# Patient Record
Sex: Male | Born: 1980 | Race: White | Hispanic: No | Marital: Single | Smoking: Never smoker
Health system: Southern US, Community
[De-identification: ages and names within clinical notes are randomized; demographics above are authoritative.]

## PROBLEM LIST (undated history)

## (undated) DIAGNOSIS — R109 Unspecified abdominal pain: Secondary | ICD-10-CM

## (undated) DIAGNOSIS — F419 Anxiety disorder, unspecified: Secondary | ICD-10-CM

## (undated) DIAGNOSIS — T7840XA Allergy, unspecified, initial encounter: Secondary | ICD-10-CM

## (undated) DIAGNOSIS — N200 Calculus of kidney: Secondary | ICD-10-CM

## (undated) DIAGNOSIS — J45909 Unspecified asthma, uncomplicated: Secondary | ICD-10-CM

## (undated) DIAGNOSIS — E119 Type 2 diabetes mellitus without complications: Secondary | ICD-10-CM

## (undated) DIAGNOSIS — I10 Essential (primary) hypertension: Secondary | ICD-10-CM

## (undated) DIAGNOSIS — M1990 Unspecified osteoarthritis, unspecified site: Secondary | ICD-10-CM

## (undated) HISTORY — DX: Unspecified abdominal pain: R10.9

## (undated) HISTORY — DX: Essential (primary) hypertension: I10

## (undated) HISTORY — PX: SURGERY SCROTAL / TESTICULAR: SUR1316

## (undated) HISTORY — DX: Unspecified asthma, uncomplicated: J45.909

## (undated) HISTORY — DX: Unspecified osteoarthritis, unspecified site: M19.90

## (undated) HISTORY — DX: Allergy, unspecified, initial encounter: T78.40XA

## (undated) HISTORY — DX: Type 2 diabetes mellitus without complications: E11.9

## (undated) HISTORY — DX: Anxiety disorder, unspecified: F41.9

## (undated) HISTORY — DX: Calculus of kidney: N20.0

## (undated) HISTORY — PX: KNEE SURGERY: SHX244

---

## 2014-04-21 HISTORY — PX: CHOLECYSTECTOMY: SHX55

## 2014-09-22 HISTORY — PX: HEMORRHOID SURGERY: SHX153

## 2018-01-30 DIAGNOSIS — R0789 Other chest pain: Secondary | ICD-10-CM

## 2018-01-30 DIAGNOSIS — E119 Type 2 diabetes mellitus without complications: Secondary | ICD-10-CM

## 2018-01-30 DIAGNOSIS — I10 Essential (primary) hypertension: Secondary | ICD-10-CM

## 2018-01-30 DIAGNOSIS — R079 Chest pain, unspecified: Secondary | ICD-10-CM

## 2018-01-30 DIAGNOSIS — E785 Hyperlipidemia, unspecified: Secondary | ICD-10-CM

## 2018-02-01 DIAGNOSIS — F419 Anxiety disorder, unspecified: Secondary | ICD-10-CM

## 2018-02-01 DIAGNOSIS — R0789 Other chest pain: Secondary | ICD-10-CM

## 2018-02-01 DIAGNOSIS — I10 Essential (primary) hypertension: Secondary | ICD-10-CM

## 2018-02-01 DIAGNOSIS — R079 Chest pain, unspecified: Secondary | ICD-10-CM

## 2019-08-18 HISTORY — PX: UPPER GASTROINTESTINAL ENDOSCOPY: SHX188

## 2019-08-18 HISTORY — PX: ESOPHAGOGASTRODUODENOSCOPY: SHX1529

## 2019-09-27 ENCOUNTER — Telehealth: Payer: Self-pay

## 2019-09-27 NOTE — Telephone Encounter (Signed)
Recv'd records from [facility] forwarded 17 pages to [facility] 6/8/21fbg

## 2019-09-29 ENCOUNTER — Telehealth: Payer: Self-pay | Admitting: Gastroenterology

## 2019-09-29 NOTE — Telephone Encounter (Signed)
Dr. Chales Abrahams, this patient requested to transfer care to you.  He was recently seen at [facility].  He would like to be evaluated for vomiting and weight loss.  He stated that he felts that his previous GI MD was dismissive of his symptoms.  Records are available in [facility] for review.  Please advise whether you will accept this patient.     Aleen Sells

## 2019-09-30 NOTE — Telephone Encounter (Signed)
Can schedule with me or any APP More than happy to help in any way I can RG

## 2019-10-03 ENCOUNTER — Encounter: Payer: Self-pay | Admitting: Gastroenterology

## 2019-10-04 NOTE — Telephone Encounter (Signed)
Appt made with Dr Chales Abrahams on 10/06/19 - pt aware

## 2019-10-06 ENCOUNTER — Encounter: Payer: Self-pay | Admitting: Gastroenterology

## 2019-10-06 ENCOUNTER — Ambulatory Visit: Payer: Self-pay | Admitting: Gastroenterology

## 2019-10-06 VITALS — BP 126/84 | HR 77 | Temp 98.4°F | Ht 71.0 in | Wt 195.5 lb

## 2019-10-06 DIAGNOSIS — R634 Abnormal weight loss: Secondary | ICD-10-CM

## 2019-10-06 DIAGNOSIS — R112 Nausea with vomiting, unspecified: Secondary | ICD-10-CM

## 2019-10-06 DIAGNOSIS — R1011 Right upper quadrant pain: Secondary | ICD-10-CM

## 2019-10-06 DIAGNOSIS — R1013 Epigastric pain: Secondary | ICD-10-CM

## 2019-10-06 MED ORDER — AMITRIPTYLINE HCL 25 MG PO TABS: 25.0000 mg | ORAL_TABLET | Freq: Every day | ORAL | 0 refills | Status: DC

## 2019-10-06 MED ORDER — RABEPRAZOLE SODIUM 20 MG PO TBEC
20.0000 mg | DELAYED_RELEASE_TABLET | Freq: Every day | ORAL | 6 refills | Status: DC
Start: 2019-10-06 — End: 2019-11-09

## 2019-10-06 NOTE — Progress Notes (Signed)
Chief Complaint: RUQ pain  Referring Provider:  Retia Passe, NP      ASSESSMENT AND PLAN;   #1.  Longstanding RUQ pain. Neg extensive GI work-up as detailed below including EGD, [place], CT AP, GES. S/P lap chole d/t biliary dyskinesia.  Failed multiple medications including BuSpar, Bentyl, Phenergan, Carafate, Protonix.  Has associated back pain/anxiety/depression.  Likely has associated nonulcer dyspepsia.  #2. GERD with [facility]. Assoc N/V  #3. Wt loss  #4. Fatty liver with Nl LFTs.  No cirrhosis. Dx [place] 04/2015  Plan: -Check CBC, CMP, Celiac, CRP, TSH, lipase. -Aciphex 20mg  po qd, #30, 6 refills. -UGI with SB series. -FU in 4 weeks. -At FU, would consider colonoscopy (d/t significant weight loss, unexplained right-sided abdominal pain), trial of librax if needed. If still with problems, consider MRI spine. -Trial of Amitrypline 25mg  po qhs #30.   HPI:    Matthew Cox is a 39 y.o. male  With history of DM2, HTN, asthma, longstanding GI problems, anxiety/depression With over 5-6 yrs H/O mod- severe RUQ abdo pain Evaluated extensively in 2017- neg 31 except fatty liver, negative CT Abdo/pelvis.  Underwent HIDA scan (report awaited), diagnosed with biliary dyskinesia and underwent laparoscopic cholecystectomy with IOC. This did help him over the next few years.  However, the abdominal pain never completely went away.  He started having similar problems earlier this year and again underwent extensive evaluation by digestive health specialists -including negative EGD except [place] hiatal hernia, negative CT Abdo/pelvis, negative ultrasound except for fatty liver.  Negative solid-phase gastric emptying scan.  Describes RUQ pain as sharp, stabbing, wrapping around the abdomen but never crossing midline.  He does have some back pain as well.  Associated with nausea/vomiting.  Not able to keep much down.  Has lost weight from 235 pounds in February 2021 to current weight of 195  pounds.(On review however, his weight was 205 pounds June 26, 2015).  He denies having any associated diarrhea or constipation.  No melena or hematochezia.  No fever chills or night sweats.  No jaundice dark urine or pale stools.  He is only able to keep yogurt, boost and easily digestible foods.  No sodas, chocolates, chewing gums, artificial sweeteners and candy. No NSAIDs  No history suggestive of lactose or gluten intolerance.  Extensive GI evaluation in the past at [facility]: -EGD 07/2019: [place] hiatal hernia, mild gastritis.  Otherwise normal EGD.  Omeprazole increased to 40 mg p.o. twice daily. Neg HP -S/P lap chole 2016 d/t biliary dyskinesia. -Negative solid-phase gastric emptying scan 09/15/2019.  Gastric emptying 83% at 120 min. -Ultrasound abdomen right upper quadrant 09/08/2019 showed mild hepatomegaly.  Liver 17.7 cm, echogenic consistent with fatty liver.  Otherwise unremarkable 09/17/2019.  CBD 6 mm.  Doppler negative.  No ascites. -CT AP 07/24/2019: With p.o. and IV contrast-negative -Eval in 2017- neg 09/23/2019 04/2015 except for fatty liver, negative CT Abdo/pelvis with p.o. and IV contrast 11/05/2014, normal CBC, CMP, lipase.  Had a HIDA scan with ejection fraction (those records are awaited), then underwent laparoscopic cholecystectomy.  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 Past Medical History:  Diagnosis Date  . Abdominal pain   . Anxiety   . Arthritis   . Diabetes (HCC)   . HTN (hypertension)   . Kidney stone     Past Surgical History:  Procedure Laterality Date  . CHOLECYSTECTOMY  2016  . ESOPHAGOGASTRODUODENOSCOPY  08/18/2019  . HEMORRHOID SURGERY  09/22/2014   Hemorrhoidectomy Dr Amalia Hailey  . KNEE SURGERY    . SURGERY SCROTAL / TESTICULAR    . UPPER GASTROINTESTINAL ENDOSCOPY  08/18/2019    Family History  Problem Relation Age of Onset  . Diabetes Father   . Colon cancer Neg Hx   .  Esophageal cancer Neg Hx     Social History   Tobacco Use  . Smoking status: Never Smoker  . Smokeless tobacco: Current User    Types: Chew  Vaping Use  . Vaping Use: Never used  Substance Use Topics  . Alcohol use: Not Currently  . Drug use: Never    Current Outpatient Medications  Medication Sig Dispense Refill  . albuterol (VENTOLIN HFA) 108 (90 Base) MCG/ACT inhaler Inhale 2 puffs into the lungs as needed.    . busPIRone (BUSPAR) 5 MG tablet Take 1 tablet by mouth 2 (two) times daily.    Marland Kitchen FLUoxetine (PROZAC) 20 MG capsule Take 1 capsule by mouth daily.    . insulin NPH-regular Human (70-30) 100 UNIT/ML injection Inject into the skin 3 (three) times daily. On sliding scale    . promethazine (PHENERGAN) 25 MG tablet Take 1 tablet by mouth as needed.     No current facility-administered medications for this visit.    Not on File  Review of Systems:  Constitutional: Denies fever, chills, diaphoresis, appetite change and has fatigue.  HEENT: Denies photophobia, eye pain, redness, hearing loss, ear pain, congestion, sore throat, rhinorrhea, sneezing, mouth sores, neck pain, neck stiffness and tinnitus.   Respiratory: Denies SOB, DOE, cough, chest tightness,  and wheezing.   Cardiovascular: Denies chest pain, palpitations and leg swelling.  Genitourinary: Denies dysuria, urgency, frequency, hematuria, flank pain and difficulty urinating.  Musculoskeletal: Has myalgias, back pain, joint swelling, arthralgias and gait problem.  Skin: No rash.  Neurological: Denies dizziness, seizures, syncope, Has weakness, light-headedness, numbness and headaches.  Hematological: Denies adenopathy. Easy bruising, personal or family bleeding history  Psychiatric/Behavioral: Has anxiety or depression, sleeping problems.     Physical Exam:    BP 126/84   Pulse 77   Temp 98.4 F (36.9 C)   Ht 5\' 11"  (1.803 m)   Wt 195 lb 8 oz (88.7 kg)   BMI 27.27 kg/m  Wt Readings from Last 3  Encounters:  10/06/19 195 lb 8 oz (88.7 kg)   Constitutional:  Well-developed, in no acute distress. Psychiatric: Normal mood and affect. Behavior is normal. HEENT: Pupils normal.  Conjunctivae are normal. No scleral icterus. Neck supple.  Cardiovascular: Normal rate, regular rhythm. No edema Pulmonary/chest: Effort normal and breath sounds normal. No wheezing, rales or rhonchi. Abdominal: Soft, nondistended.  Has epigastric and right upper quadrant abdominal tenderness.  Well-healed surgical scars, bowel sounds active throughout. There are no masses palpable. No hepatomegaly. Rectal:  defered Neurological: Alert and oriented to person place and time. Skin: Skin is warm and dry. No rashes noted.  Data Reviewed: I have personally reviewed following labs and imaging studies Extensive data was reviewed.   Carmell Austria, MD 10/06/2019, 10:29 AM  Cc: Barnie Mort, NP

## 2019-10-06 NOTE — Patient Instructions (Signed)
If you are age 37 or older, your body mass index should be between 23-30. Your Body mass index is 27.27 kg/m. If this is out of the aforementioned range listed, please consider follow up with your Primary Care Provider.  If you are age 58 or younger, your body mass index should be between 19-25. Your Body mass index is 27.27 kg/m. If this is out of the aformentioned range listed, please consider follow up with your Primary Care Provider.   You have been scheduled for an Upper GI Series and [place] Bowel Follow Thru at [facility]. Your appointment is on     at             . Please arrive 15 minutes prior to your test for registration. Make certain not to have anything to eat or drink after midnight on the night before your test. If you need to reschedule, please contact radiology at [phone]. --------------------------------------------------------------------------------------------------------------- An upper GI series uses x rays to help diagnose problems of the upper GI tract, which includes the esophagus, stomach, and duodenum. The duodenum is the first part of the Klecka intestine. An upper GI series is conducted by a radiology technologist or a radiologist--a doctor who specializes in x-ray imaging--at a hospital or outpatient center. While sitting or standing in front of an x-ray machine, the patient drinks barium liquid, which is often white and has a chalky consistency and taste. The barium liquid coats the lining of the upper GI tract and makes signs of disease show up more clearly on x rays. X-ray video, called fluoroscopy, is used to view the barium liquid moving through the esophagus, stomach, and duodenum. Additional x rays and fluoroscopy are performed while the patient lies on an x-ray table. To fully coat the upper GI tract with barium liquid, the technologist or radiologist may press on the abdomen or ask the patient to change position. Patients hold still in various  positions, allowing the technologist or radiologist to take x rays of the upper GI tract at different angles. If a technologist conducts the upper GI series, a radiologist will later examine the images to look for problems.  This test typically takes about 1 hour to complete --------------------------------------------------------------------------------------------------------------------------------------------- The [place] Bowel Follow Thru examination is used to visualize the entire Nuccio bowel (intestines); specifically the connection between the Salek and large intestine. You will be positioned on a flat x-ray table and an image of your abdomen taken. Then the technologist will show the x-ray to the radiologist. The radiologist will instruct your technologist how much (1-2 cups) barium sulfate you will drink and when to begin taking the timed x-rays, usually 15-30 minutes after you begin drinking. Barium is a harmless substance that will highlight your Filippone intestine by absorbing x-ray. The taste is chalky and it feels very heavy both in the cup and in your stomach.  After the first x-ray is taken and shown to the radiologist, he/she will determine when the next image is to be taken. This is repeated until the barium has reached the end of the Mccumbers intestine and enters the beginning of the colon (cecum). At such time when the barium spills into the colon, you will be positioned on the x-ray table once again. The radiologist will use a fluoroscopic camera to take some detailed pictures of the connection between your Tupper intestine and colon. The fluoroscope is an x-ray unit that works with a television/computer screen. The radiologist will apply pressure to your abdomen with his/her  hand and a lead glove, a plastic paddle, or a paddle with an inflated rubber balloon on the end. This is to spread apart your loops of intestine so he/she can see all areas.   This test typically takes around 1 hour to  complete. Important Drink plenty of water (8-10 cups/day) for a few days following the procedure to avoid constipation and blockage. The barium will make your stools white for a few days. --------------------------------------------------------------------------------------------------------------------------------------------  We have sent the following medications to your pharmacy for you to pick up at your convenience: Aciphex 20 mg daily.  Your provider has requested that you go to the basement for labwork at  [place]. [address], [place] [address]. Press "B" on the elevator. The lab is located at the first door on the left as you exit the elevator.  Follow up with me in four weeks.  Call for an appointment  Thank you for choosing me and [facility].   Jackquline Denmark, MD

## 2019-10-07 ENCOUNTER — Telehealth: Payer: Self-pay | Admitting: Gastroenterology

## 2019-10-07 NOTE — Telephone Encounter (Signed)
Left message on patients voicemail regarding UGI series scheduled at [facility] for 10/14/19 at 9:30 a.m. Instructions mailed to patient.  Patient to return call if further questions.

## 2019-10-07 NOTE — Addendum Note (Signed)
Addended by: Zachary George on: 10/07/2019 10:19 AM   Modules accepted: Orders

## 2019-10-14 ENCOUNTER — Other Ambulatory Visit: Payer: Self-pay

## 2019-10-14 ENCOUNTER — Ambulatory Visit (HOSPITAL_COMMUNITY)
Admission: RE | Admit: 2019-10-14 | Discharge: 2019-10-14 | Disposition: A | Discharge status: Home / Self Care | Payer: Self-pay | Source: Ambulatory Visit | Attending: Gastroenterology | Admitting: Gastroenterology

## 2019-10-14 ENCOUNTER — Other Ambulatory Visit (INDEPENDENT_AMBULATORY_CARE_PROVIDER_SITE_OTHER): Payer: Self-pay

## 2019-10-14 ENCOUNTER — Other Ambulatory Visit: Payer: Self-pay | Admitting: Gastroenterology

## 2019-10-14 DIAGNOSIS — R1013 Epigastric pain: Secondary | ICD-10-CM | POA: Insufficient documentation

## 2019-10-14 DIAGNOSIS — R1011 Right upper quadrant pain: Secondary | ICD-10-CM

## 2019-10-14 DIAGNOSIS — R112 Nausea with vomiting, unspecified: Secondary | ICD-10-CM | POA: Insufficient documentation

## 2019-10-14 DIAGNOSIS — R634 Abnormal weight loss: Secondary | ICD-10-CM

## 2019-10-14 LAB — TSH: TSH: 2.11 u[IU]/mL (ref 0.35–4.50)

## 2019-10-14 LAB — CBC WITH DIFFERENTIAL/PLATELET
Basophils Absolute: 0.1 10*3/uL (ref 0.0–0.1)
Basophils Relative: 1 % (ref 0.0–3.0)
Eosinophils Absolute: 0.2 10*3/uL (ref 0.0–0.7)
Eosinophils Relative: 2.6 % (ref 0.0–5.0)
HCT: 42.8 % (ref 39.0–52.0)
Hemoglobin: 14.9 g/dL (ref 13.0–17.0)
Lymphocytes Relative: 39.3 % (ref 12.0–46.0)
Lymphs Abs: 2.9 10*3/uL (ref 0.7–4.0)
MCHC: 34.9 g/dL (ref 30.0–36.0)
MCV: 76.5 fl — ABNORMAL LOW (ref 78.0–100.0)
Monocytes Absolute: 0.5 10*3/uL (ref 0.1–1.0)
Monocytes Relative: 6.5 % (ref 3.0–12.0)
Neutro Abs: 3.7 10*3/uL (ref 1.4–7.7)
Neutrophils Relative %: 50.6 % (ref 43.0–77.0)
Platelets: 228 10*3/uL (ref 150.0–400.0)
RBC: 5.59 Mil/uL (ref 4.22–5.81)
RDW: 13.8 % (ref 11.5–15.5)
WBC: 7.4 10*3/uL (ref 4.0–10.5)

## 2019-10-14 LAB — COMPREHENSIVE METABOLIC PANEL
ALT: 19 U/L (ref 0–53)
AST: 23 U/L (ref 0–37)
Albumin: 4.5 g/dL (ref 3.5–5.2)
Alkaline Phosphatase: 101 U/L (ref 39–117)
BUN: 11 mg/dL (ref 6–23)
CO2: 23 mEq/L (ref 19–32)
Calcium: 10 mg/dL (ref 8.4–10.5)
Chloride: 102 mEq/L (ref 96–112)
Creatinine, Ser: 0.96 mg/dL (ref 0.40–1.50)
GFR: 88.39 mL/min (ref >60.00)
Glucose, Bld: 123 mg/dL — ABNORMAL HIGH (ref 70–99)
Potassium: 3.8 mEq/L (ref 3.5–5.1)
Sodium: 141 mEq/L (ref 135–145)
Total Bilirubin: 1.1 mg/dL (ref 0.2–1.2)
Total Protein: 7.8 g/dL (ref 6.0–8.3)

## 2019-10-14 LAB — LIPASE: Lipase: 15 U/L (ref 11.0–59.0)

## 2019-10-14 LAB — HIGH SENSITIVITY CRP: CRP, High Sensitivity: 15.27 mg/L — ABNORMAL HIGH (ref 0.000–5.000)

## 2019-10-17 ENCOUNTER — Other Ambulatory Visit: Payer: Self-pay

## 2019-10-17 DIAGNOSIS — R634 Abnormal weight loss: Secondary | ICD-10-CM

## 2019-10-17 DIAGNOSIS — R112 Nausea with vomiting, unspecified: Secondary | ICD-10-CM

## 2019-10-17 DIAGNOSIS — R1013 Epigastric pain: Secondary | ICD-10-CM

## 2019-10-18 ENCOUNTER — Other Ambulatory Visit: Payer: Self-pay

## 2019-10-18 DIAGNOSIS — R634 Abnormal weight loss: Secondary | ICD-10-CM

## 2019-10-18 DIAGNOSIS — R1013 Epigastric pain: Secondary | ICD-10-CM

## 2019-10-18 DIAGNOSIS — R112 Nausea with vomiting, unspecified: Secondary | ICD-10-CM

## 2019-10-18 LAB — CELIAC PANEL 10
Antigliadin Abs, IgA: 5 units (ref 0–19)
Endomysial IgA: NEGATIVE
Gliadin IgG: 3 units (ref 0–19)
IgA/Immunoglobulin A, Serum: 205 mg/dL (ref 90–386)
Tissue Transglut Ab: 5 U/mL (ref 0–5)
Transglutaminase IgA: <2 U/mL (ref 0–3)

## 2019-10-18 LAB — IRON,TIBC AND FERRITIN PANEL
%SAT: 32 % (calc) (ref 20–48)
Ferritin: 218 ng/mL (ref 38–380)
Iron: 102 ug/dL (ref 50–180)
TIBC: 323 mcg/dL (calc) (ref 250–425)

## 2019-10-19 ENCOUNTER — Telehealth: Payer: Self-pay | Admitting: Gastroenterology

## 2019-10-19 NOTE — Telephone Encounter (Signed)
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  We will call or send a MyChart message once he does.

## 2019-10-19 NOTE — Telephone Encounter (Signed)
Patient calling about lab results that are already avail in [place], please call patient.

## 2019-10-20 ENCOUNTER — Other Ambulatory Visit: Payer: Self-pay

## 2019-10-20 DIAGNOSIS — R634 Abnormal weight loss: Secondary | ICD-10-CM

## 2019-10-20 DIAGNOSIS — R109 Unspecified abdominal pain: Secondary | ICD-10-CM

## 2019-10-27 ENCOUNTER — Other Ambulatory Visit: Payer: Self-pay | Admitting: Gastroenterology

## 2019-11-09 ENCOUNTER — Ambulatory Visit: Payer: Self-pay | Admitting: Gastroenterology

## 2019-11-09 ENCOUNTER — Other Ambulatory Visit (INDEPENDENT_AMBULATORY_CARE_PROVIDER_SITE_OTHER): Payer: Self-pay

## 2019-11-09 ENCOUNTER — Encounter: Payer: Self-pay | Admitting: Gastroenterology

## 2019-11-09 VITALS — BP 114/80 | HR 89 | Ht 71.0 in | Wt 186.2 lb

## 2019-11-09 DIAGNOSIS — R634 Abnormal weight loss: Secondary | ICD-10-CM

## 2019-11-09 DIAGNOSIS — R109 Unspecified abdominal pain: Secondary | ICD-10-CM

## 2019-11-09 LAB — HEMOCCULT SLIDES (X 3 CARDS)
Fecal Occult Blood: NEGATIVE
OCCULT 1: NEGATIVE
OCCULT 2: NEGATIVE
OCCULT 3: NEGATIVE
OCCULT 4: NEGATIVE
OCCULT 5: NEGATIVE

## 2019-11-09 MED ORDER — RABEPRAZOLE SODIUM 20 MG PO TBEC: 20.0000 mg | DELAYED_RELEASE_TABLET | Freq: Every day | ORAL | 6 refills | Status: DC

## 2019-11-09 NOTE — Patient Instructions (Addendum)
If you are age 37 or older, your body mass index should be between 23-30. Your Body mass index is 25.98 kg/m. If this is out of the aforementioned range listed, please consider follow up with your Primary Care Provider.  If you are age 60 or younger, your body mass index should be between 19-25. Your Body mass index is 25.98 kg/m. If this is out of the aformentioned range listed, please consider follow up with your Primary Care Provider.    You have been scheduled for a colonoscopy. Please follow written instructions given to you at your visit today.  Please pick up your prep supplies at the pharmacy within the next 1-3 days. If you use inhalers (even only as needed), please bring them with you on the day of your procedure.   We have sent the following medications to your pharmacy for you to pick up at your convenience: Aciphex    Thank you,  Dr. Lynann Bologna

## 2019-11-09 NOTE — Progress Notes (Signed)
Chief Complaint: RUQ pain  Referring Provider:  Retia Passe, NP      ASSESSMENT AND PLAN;   #1.  Longstanding RUQ pain. Neg extensive GI WP as detailed below including EGD, [place], CT AP, GES. S/P lap chole d/t biliary dyskinesia, neg UGI with SB series.  Failed multiple medications including BuSpar, Bentyl, Phenergan, Carafate, Protonix.  Has associated back pain/anxiety/depression.  Likely has associated nonulcer dyspepsia.  #2. GERD with [facility]. Assoc N/V. Neg UGI with SB series 10/14/2019  #3. Wt loss (9lb over last 1 month). Neg celiac screen, Nl TSH.  #4. Fatty liver with Nl LFTs.  No cirrhosis. Dx [place] 04/2015  Plan: -Continue Aciphex 20mg  po qd, #30, 6 refills. -colonoscopy (d/t significant weight loss, unexplained right-sided abdominal pain, Low MCV and elevated CRP), thereafter, trial of librax if needed. If still with problems, consider MRI spine. -Continue Amitrypline 25mg  po qhs #30.  This has helped.   HPI:    Matthew Cox is a 39 y.o. male  For follow-up visit.  His nausea/vomiting is much better.  He still has chronic abdominal pain with extensive negative work-up.  Upper GI with [place] bowel follow-through on 10/14/2019 was negative except for constipation.  He has started MiraLAX once a day with good resultant improvement in constipation.  Currently having normal bowel movements.  No melena or hematochezia.  His labs revealed normal CBC except for decreased MCV at 76, elevated CRP.  Negative celiac screen, TSH and normal CMP.  Wt Readings from Last 3 Encounters:  11/09/19 186 lb 4 oz (84.5 kg)  10/06/19 195 lb 8 oz (88.7 kg)   Unfortunately, he had hairline fracture R ankle and is currently under Ortho care.     From previous notes: with history of DM2, HTN, asthma, longstanding GI problems, anxiety/depression With over 5-6 yrs H/O mod- severe RUQ abdo pain Evaluated extensively in 2017- neg 10/08/19 except fatty liver, negative CT Abdo/pelvis.  Underwent  HIDA scan (report awaited), diagnosed with biliary dyskinesia and underwent laparoscopic cholecystectomy with IOC. This did help him over the next few years.  However, the abdominal pain never completely went away.  He started having similar problems earlier this year and again underwent extensive evaluation by digestive health specialists -including negative EGD except [place] hiatal hernia, negative CT Abdo/pelvis, negative ultrasound except for fatty liver.  Negative solid-phase gastric emptying scan.  Describes RUQ pain as sharp, stabbing, wrapping around the abdomen but never crossing midline.  He does have some back pain as well.  Associated with nausea/vomiting.  Not able to keep much down.  Has lost weight from 235 pounds in February 2021 to current weight of 195 pounds.(On review however, his weight was 205 pounds June 26, 2015).  He denies having any associated diarrhea or constipation.  No melena or hematochezia.  No fever chills or night sweats.  No jaundice dark urine or pale stools.  He is only able to keep yogurt, boost and easily digestible foods.  No sodas, chocolates, chewing gums, artificial sweeteners and candy. No NSAIDs  No history suggestive of lactose or gluten intolerance.  Extensive GI evaluation in the past at digestive health specialists/[facility]: -EGD 07/2019: [place] hiatal hernia, mild gastritis.  Otherwise normal EGD.  Omeprazole increased to 40 mg p.o. twice daily. Neg HP -S/P lap chole 2016 d/t biliary dyskinesia. -Negative solid-phase gastric emptying scan 09/15/2019.  Gastric emptying 83% at 120 min. -Ultrasound abdomen right upper quadrant 09/08/2019 showed mild hepatomegaly.  Liver 17.7 cm, echogenic  consistent with fatty liver.  Otherwise unremarkable [place].  CBD 6 mm.  Doppler negative.  No ascites. -CT AP 07/24/2019: With p.o. and IV contrast-negative -Eval in 2017- neg [place] 04/2015 except for fatty liver, negative CT Abdo/pelvis with p.o. and IV contrast 11/05/2014, normal  CBC, CMP, lipase.  Had a HIDA scan with ejection fraction (those records are awaited), then underwent laparoscopic cholecystectomy.  Upper GI with [place] bowel follow-through 10/14/2019: Negative except for constipation.    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 Past Medical History:  Diagnosis Date  . Abdominal pain   . Anxiety   . Arthritis   . Diabetes (HCC)   . HTN (hypertension)   . Kidney stone     Past Surgical History:  Procedure Laterality Date  . CHOLECYSTECTOMY  2016  . ESOPHAGOGASTRODUODENOSCOPY  08/18/2019  . HEMORRHOID SURGERY  09/22/2014   Hemorrhoidectomy Dr Logan Bores  . KNEE SURGERY    . SURGERY SCROTAL / TESTICULAR    . UPPER GASTROINTESTINAL ENDOSCOPY  08/18/2019    Family History  Problem Relation Age of Onset  . Diabetes Father   . Colon cancer Neg Hx   . Esophageal cancer Neg Hx     Social History   Tobacco Use  . Smoking status: Never Smoker  . Smokeless tobacco: Current User    Types: Chew  Vaping Use  . Vaping Use: Never used  Substance Use Topics  . Alcohol use: Not Currently  . Drug use: Never    Current Outpatient Medications  Medication Sig Dispense Refill  . albuterol (VENTOLIN HFA) 108 (90 Base) MCG/ACT inhaler Inhale 2 puffs into the lungs as needed.    Marland Kitchen amitriptyline (ELAVIL) 25 MG tablet TAKE 1 TABLET BY MOUTH AT BEDTIME 30 tablet 0  . busPIRone (BUSPAR) 5 MG tablet Take 1 tablet by mouth 2 (two) times daily.    Marland Kitchen FLUoxetine (PROZAC) 20 MG capsule Take 1 capsule by mouth daily.    . insulin NPH-regular Human (70-30) 100 UNIT/ML injection Inject into the skin 3 (three) times daily. On sliding scale    . promethazine (PHENERGAN) 25 MG tablet Take 1 tablet by mouth as needed.    . RABEprazole (ACIPHEX) 20 MG tablet Take 1 tablet (20 mg total) by mouth daily. 30 tablet 6   No current facility-administered medications for this visit.    Not on File  Review of Systems:    Constitutional: Denies fever, chills, diaphoresis, appetite change and has fatigue.  HEENT: Denies photophobia, eye pain, redness, hearing loss, ear pain, congestion, sore throat, rhinorrhea, sneezing, mouth sores, neck pain, neck stiffness and tinnitus.   Respiratory: Denies SOB, DOE, cough, chest tightness,  and wheezing.   Cardiovascular: Denies chest pain, palpitations and leg swelling.  Genitourinary: Denies dysuria, urgency, frequency, hematuria, flank pain and difficulty urinating.  Musculoskeletal: Has myalgias, back pain, joint swelling, arthralgias and gait problem.  Skin: No rash.  Neurological: Denies dizziness, seizures, syncope, Has weakness, light-headedness, numbness and headaches.  Hematological: Denies adenopathy. Easy bruising, personal or family bleeding history  Psychiatric/Behavioral: Has anxiety or depression, sleeping problems.     Physical Exam:    BP 114/80   Pulse 89   Ht 5\' 11"  (1.803 m)   Wt 186 lb 4 oz (84.5 kg)   BMI 25.98 kg/m  Wt Readings from Last 3 Encounters:  11/09/19 186 lb 4 oz (84.5 kg)  10/06/19 195 lb 8 oz (88.7  kg)   Constitutional:  Well-developed, in no acute distress. Psychiatric: Normal mood and affect. Behavior is normal. HEENT: Pupils normal.  Conjunctivae are normal. No scleral icterus. Neck supple.  Cardiovascular: Normal rate, regular rhythm. No edema Pulmonary/chest: Effort normal and breath sounds normal. No wheezing, rales or rhonchi. Abdominal: Soft, nondistended.  Has epigastric and right upper quadrant abdominal tenderness.  Well-healed surgical scars, bowel sounds active throughout. There are no masses palpable. No hepatomegaly. Rectal:  defered Neurological: Alert and oriented to person place and time. Skin: Skin is warm and dry. No rashes noted.  Data Reviewed: I have personally reviewed following labs and imaging studies Extensive data was reviewed.   Edman Circle, MD 11/09/2019, 10:57 AM  Cc: Retia Passe,  NP

## 2019-11-14 ENCOUNTER — Telehealth: Payer: Self-pay | Admitting: Gastroenterology

## 2019-11-14 NOTE — Telephone Encounter (Signed)
Spoke with patient.  Went over upper portion of instructions regarding what he needed to purchase at pharmacy. Patient verbalized understanding. Also, went over covid screening instructions.

## 2019-11-14 NOTE — Telephone Encounter (Signed)
Pt is requesting a call back from a nurse in regards to his prep that he recently purchased. Pt wants to verify if he obtained the correct stuff

## 2019-11-23 ENCOUNTER — Ambulatory Visit (INDEPENDENT_AMBULATORY_CARE_PROVIDER_SITE_OTHER): Payer: Self-pay

## 2019-11-23 ENCOUNTER — Other Ambulatory Visit: Payer: Self-pay | Admitting: Gastroenterology

## 2019-11-23 ENCOUNTER — Ambulatory Visit: Payer: Self-pay | Admitting: Gastroenterology

## 2019-11-23 DIAGNOSIS — Z1159 Encounter for screening for other viral diseases: Secondary | ICD-10-CM

## 2019-11-23 LAB — SARS CORONAVIRUS 2 (TAT 6-24 HRS): SARS Coronavirus 2: NEGATIVE

## 2019-11-24 ENCOUNTER — Telehealth: Payer: Self-pay | Admitting: Gastroenterology

## 2019-11-24 NOTE — Telephone Encounter (Signed)
Pt states he started drinking his miralax but it has not urged him to go to the bathroom yet. Pt has procedure scheduled for tomorrow.

## 2019-11-24 NOTE — Telephone Encounter (Signed)
Pt states he "started drinking Miralax this am, with no results."  He has not been following our instructions regarding drinking the prep today.  He drank 32 oz of Gatorade with 119 gm of Miralax at 7:30 am. He is not able to go the pharmacy to pick up a prep or come to our building either to pick up a sample He has another 32 oz of Gatorade and 119 grams of Miralax in his cupboard.  I told him to mix that right now and to follow our directions exactly now- take Dulcolax 4 tablets at 3:00 pm, then 32 ounces of Miralax and Gartoade at 5:00 pm today.  Drink another 32 oz of Miralax and Gatorade at 6:30 am tomorrow.  NPO at 8:30.  Understanding voiced and on call MD's number given if needed

## 2019-11-25 ENCOUNTER — Encounter: Payer: Self-pay | Admitting: Gastroenterology

## 2019-11-25 ENCOUNTER — Ambulatory Visit (AMBULATORY_SURGERY_CENTER): Payer: Self-pay | Admitting: Gastroenterology

## 2019-11-25 ENCOUNTER — Other Ambulatory Visit: Payer: Self-pay

## 2019-11-25 VITALS — BP 122/84 | HR 79 | Temp 97.1°F | Resp 16 | Ht 71.0 in | Wt 170.0 lb

## 2019-11-25 DIAGNOSIS — R1031 Right lower quadrant pain: Secondary | ICD-10-CM

## 2019-11-25 DIAGNOSIS — K648 Other hemorrhoids: Secondary | ICD-10-CM

## 2019-11-25 DIAGNOSIS — R634 Abnormal weight loss: Secondary | ICD-10-CM

## 2019-11-25 MED ORDER — SODIUM CHLORIDE 0.9 % IV SOLN: 500.0000 mL | Freq: Once | INTRAVENOUS | Status: DC

## 2019-11-25 NOTE — Progress Notes (Signed)
Called to room to assist during endoscopic procedure.  Patient ID and intended procedure confirmed with present staff. Received instructions for my participation in the procedure from the performing physician.  

## 2019-11-25 NOTE — Patient Instructions (Signed)
Thank you for letting us take care of your healthcare needs today. Please see handouts given to you on Diverticulosis and Hemorrhoids. ° ° °YOU HAD AN ENDOSCOPIC PROCEDURE TODAY AT THE [facility]:   Refer to the procedure report that was given to you for any specific questions about what was found during the examination.  If the procedure report does not answer your questions, please call your gastroenterologist to clarify.  If you requested that your care partner not be given the details of your procedure findings, then the procedure report has been included in a sealed envelope for you to review at your convenience later. ° °YOU SHOULD EXPECT: Some feelings of bloating in the abdomen. Passage of more gas than usual.  Walking can help get rid of the air that was put into your GI tract during the procedure and reduce the bloating. If you had a lower endoscopy (such as a colonoscopy or flexible sigmoidoscopy) you may notice spotting of blood in your stool or on the toilet paper. If you underwent a bowel prep for your procedure, you may not have a normal bowel movement for a few days. ° °Please Note:  You might notice some irritation and congestion in your nose or some drainage.  This is from the oxygen used during your procedure.  There is no need for concern and it should clear up in a day or so. ° °SYMPTOMS TO REPORT IMMEDIATELY: ° °Following lower endoscopy (colonoscopy or flexible sigmoidoscopy): ° Excessive amounts of blood in the stool ° Significant tenderness or worsening of abdominal pains ° Swelling of the abdomen that is new, acute ° Fever of 100°F or higher ° °  °For urgent or emergent issues, a gastroenterologist can be reached at any hour by calling [phone]. °Do not use MyChart messaging for urgent concerns.  ° ° °DIET:  We do recommend a Alcaide meal at first, but then you may proceed to your regular diet.  Drink plenty of fluids but you should avoid alcoholic beverages for 24  hours. ° °ACTIVITY:  You should plan to take it easy for the rest of today and you should NOT DRIVE or use heavy machinery until tomorrow (because of the sedation medicines used during the test).   ° °FOLLOW UP: °Our staff will call the number listed on your records 48-72 hours following your procedure to check on you and address any questions or concerns that you may have regarding the information given to you following your procedure. If we do not reach you, we will leave a message.  We will attempt to reach you two times.  During this call, we will ask if you have developed any symptoms of COVID 19. If you develop any symptoms (ie: fever, flu-like symptoms, shortness of breath, cough etc.) before then, please call [phone].  If you test positive for Covid 19 in the 2 weeks post procedure, please call and report this information to us.   ° °If any biopsies were taken you will be contacted by phone or by letter within the next 1-3 weeks.  Please call us at [phone] if you have not heard about the biopsies in 3 weeks.  ° ° °SIGNATURES/CONFIDENTIALITY: °You and/or your care partner have signed paperwork which will be entered into your electronic medical record.  These signatures attest to the fact that that the information above on your After Visit Summary has been reviewed and is understood.  Full responsibility of the confidentiality of this discharge information   lies with you and/or your care-partner.  °

## 2019-11-25 NOTE — Progress Notes (Signed)
Report given to PACU, vss 

## 2019-11-25 NOTE — Op Note (Signed)
[facility] Patient Name: Matthew Cox Procedure Date: 11/25/2019 11:28 AM MRN: [id number] Endoscopist: Lynann Bologna , MD Age: 39 Referring MD:  Date of Birth: 1981-02-02 Gender: Male Account #: [id number] Procedure:                Colonoscopy Indications:              Abdominal pain in the right upper quadrant. Weight                            loss. Elevated CRP r/o Crohn's. Medicines:                Monitored Anesthesia Care Procedure:                Pre-Anesthesia Assessment:                           - Prior to the procedure, a History and Physical                            was performed, and patient medications and                            allergies were reviewed. The patient's tolerance of                            previous anesthesia was also reviewed. The risks                            and benefits of the procedure and the sedation                            options and risks were discussed with the patient.                            All questions were answered, and informed consent                            was obtained. Prior Anticoagulants: The patient has                            taken no previous anticoagulant or antiplatelet                            agents. ASA Grade Assessment: II - A patient with                            mild systemic disease. After reviewing the risks                            and benefits, the patient was deemed in                            satisfactory condition to undergo the procedure.  After obtaining informed consent, the colonoscope                            was passed under direct vision. Throughout the                            procedure, the patient's blood pressure, pulse, and                            oxygen saturations were monitored continuously. The                            Colonoscope was introduced through the anus and                            advanced to the 4 cm into the  ileum. The                            colonoscopy was performed without difficulty. The                            patient tolerated the procedure well. The quality                            of the bowel preparation was adequate to identify                            polyps. The terminal ileum, ileocecal valve,                            appendiceal orifice, and rectum were photographed. Scope In: 11:34:50 AM Scope Out: 11:47:12 AM Scope Withdrawal Time: 0 hours 10 minutes 11 seconds  Total Procedure Duration: 0 hours 12 minutes 22 seconds  Findings:                 A few rare Hashemi-mouthed diverticula were found in                            the sigmoid colon.                           The colon (entire examined portion) appeared                            normal. Biopsies for histology were taken with a                            cold forceps from the entire colon for evaluation                            of microscopic colitis.                           The terminal ileum appeared normal. Biopsies were  taken with a cold forceps for histology.                           Non-bleeding internal hemorrhoids were found during                            retroflexion. The hemorrhoids were Frerichs.                           The exam was otherwise without abnormality on                            direct and retroflexion views. Complications:            No immediate complications. Estimated Blood Loss:     Estimated blood loss: none. Impression:               -Minimal sigmoid diverticulosis.                           -Bernasconi internal hemorrhoids.                           -Otherwise grossly normal colonoscopy to TI. Recommendation:           - Patient has a contact number available for                            emergencies. The signs and symptoms of potential                            delayed complications were discussed with the                            patient.  Return to normal activities tomorrow.                            Written discharge instructions were provided to the                            patient.                           - Resume previous diet.                           - Continue present medications.                           - Await pathology results.                           - Repeat colonoscopy for screening at age 39 as per                            current recommendations. Earlier, if with any new  or red flag symptoms.                           - Return to GI clinic in 3 months. Lynann Bologna, MD 11/25/2019 12:01:45 PM This report has been signed electronically.

## 2019-11-29 ENCOUNTER — Telehealth: Payer: Self-pay

## 2019-11-29 NOTE — Telephone Encounter (Signed)
Left message on follow up call. 

## 2019-11-29 NOTE — Telephone Encounter (Signed)
LVM

## 2019-11-30 ENCOUNTER — Other Ambulatory Visit: Payer: Self-pay | Admitting: Gastroenterology

## 2019-12-01 ENCOUNTER — Telehealth: Payer: Self-pay | Admitting: Gastroenterology

## 2019-12-01 NOTE — Telephone Encounter (Signed)
Spoke to patient who states that he is still having occasional vomiting when he eats certain foods. He takes Phenergan 25 mg as needed. No fevers,and he is having normal bowel movements. Patient was advised by Dr Chales Abrahams to eat a bland diet,and continue to drink Boost. Patient is scheduled for an office viosit to discuss his symptoms. All questions answered. Patient voiced understanding.

## 2019-12-01 NOTE — Telephone Encounter (Signed)
Patient having abdominal pain, throwing up every time he eats and is nauseous even with the bland diet he was recommended to start. Please advise

## 2019-12-06 ENCOUNTER — Encounter: Payer: Self-pay | Admitting: Gastroenterology

## 2019-12-13 ENCOUNTER — Encounter (INDEPENDENT_AMBULATORY_CARE_PROVIDER_SITE_OTHER): Payer: Self-pay

## 2020-01-31 ENCOUNTER — Encounter: Payer: Self-pay | Admitting: Gastroenterology

## 2020-01-31 ENCOUNTER — Ambulatory Visit (INDEPENDENT_AMBULATORY_CARE_PROVIDER_SITE_OTHER): Payer: Self-pay | Admitting: Gastroenterology

## 2020-01-31 VITALS — BP 116/76 | HR 93 | Ht 71.0 in | Wt 164.4 lb

## 2020-01-31 DIAGNOSIS — K219 Gastro-esophageal reflux disease without esophagitis: Secondary | ICD-10-CM

## 2020-01-31 DIAGNOSIS — R112 Nausea with vomiting, unspecified: Secondary | ICD-10-CM

## 2020-01-31 DIAGNOSIS — R1011 Right upper quadrant pain: Secondary | ICD-10-CM

## 2020-01-31 DIAGNOSIS — R634 Abnormal weight loss: Secondary | ICD-10-CM

## 2020-01-31 MED ORDER — RABEPRAZOLE SODIUM 20 MG PO TBEC
20.0000 mg | DELAYED_RELEASE_TABLET | Freq: Two times a day (BID) | ORAL | 6 refills | Status: AC
Start: 2020-01-31 — End: ?

## 2020-01-31 MED ORDER — AMITRIPTYLINE HCL 50 MG PO TABS: 50.0000 mg | ORAL_TABLET | Freq: Every day | ORAL | 6 refills | Status: AC

## 2020-01-31 NOTE — Patient Instructions (Signed)
If you are age 37 or older, your body mass index should be between 23-30. Your Body mass index is 22.93 kg/m. If this is out of the aforementioned range listed, please consider follow up with your Primary Care Provider.  If you are age 43 or younger, your body mass index should be between 19-25. Your Body mass index is 22.93 kg/m. If this is out of the aformentioned range listed, please consider follow up with your Primary Care Provider.   You have been scheduled for an endoscopy. Please follow written instructions given to you at your visit today. If you use inhalers (even only as needed), please bring them with you on the day of your procedure.  We have sent the following medications to your pharmacy for you to pick up at your convenience: Amitriptyline Aciphex   Thank you,  Dr. Lynann Bologna

## 2020-01-31 NOTE — Progress Notes (Signed)
Chief Complaint: RUQ pain  Referring Provider:  Yvonne Kendall, NP      ASSESSMENT AND PLAN;   #1.  Recurrent N/V with wt loss  #2. Longstanding RUQ pain. Neg extensive GI WP as detailed below including EGD, [place], CT AP, GES. S/P lap chole d/t biliary dyskinesia, neg UGI with SB series.  Failed multiple medications including BuSpar, Bentyl, Phenergan, Carafate, Protonix.  Has associated back pain/anxiety/depression.  Likely has associated nonulcer dyspepsia.  #3. GERD with [facility]. Assoc N/V. Neg UGI with SB series 10/14/2019  #4. Wt loss. Neg celiac screen, Nl TSH. Neg colon 11/25/2019  #5. Fatty liver with Nl LFTs.  No cirrhosis. Dx [place] 04/2015  Plan: -Increase Aciphex 20mg  po bid, #30, 6 refills. -Repeat EGD d/t continued symptoms with associated weight loss.  If with continued weight loss, would have low threshold in getting CTA Abdo/pelvis. -Continue Zofran on as needed basis. -Increase Amitrypline 50 mg po qhs #30.  This has helped. -? Trial of remeron therafter, if OK with Ms .  This would also stimulate his appetite.   HPI:    Matthew Cox is a 39 y.o. male  For follow-up visit.  N/V getting worse with wt loss. Only able to keep down crackers, boost, spirte.  Would like to get EGD done again.  He still has chronic RUQ abdominal pain with extensive neg work-up.  Concerned about continued weight loss as below  Wt Readings from Last 3 Encounters:  01/31/20 164 lb 6 oz (74.6 kg)  11/25/19 170 lb (77.1 kg)  11/09/19 186 lb 4 oz (84.5 kg)   No fever chills or night sweats.  He has been sleeping better ever since he has been on amitriptyline.  Most recently had negative colonoscopy to TI with negative random colonic and TI biopsies.  Upper GI with Ojeda bowel follow-through on 10/14/2019 was negative except for constipation.  He has started MiraLAX once a day with good resultant improvement in constipation.  Currently having normal bowel movements.  No melena or  hematochezia.  His labs revealed normal CBC except for decreased MCV at 76, elevated CRP.  Negative celiac screen, TSH and normal CMP.  Unfortunately, he had hairline fracture R ankle and is currently under Ortho care.     From previous notes: with history of DM2, HTN, asthma, longstanding GI problems, anxiety/depression With over 5-6 yrs H/O mod- severe RUQ abdo pain Evaluated extensively in 2017- neg 2018 except fatty liver, negative CT Abdo/pelvis.  Underwent HIDA scan (report awaited), diagnosed with biliary dyskinesia and underwent laparoscopic cholecystectomy with IOC. This did help him over the next few years.  However, the abdominal pain never completely went away.  He started having similar problems earlier this year and again underwent extensive evaluation by digestive health specialists -including neg EGD except Cislo hiatal hernia, negative CT Abdo/pelvis, negative ultrasound except for fatty liver.  Negative solid-phase gastric emptying scan.  Describes RUQ pain as sharp, stabbing, wrapping around the abdomen but never crossing midline.  He does have some back pain as well.  Associated with nausea/vomiting.  Not able to keep much down.  Has lost weight from 235 pounds in February 2021 to current weight of 195 pounds.(On review however, his weight was 205 pounds June 26, 2015).  He denies having any associated diarrhea or constipation.  No melena or hematochezia.  No fever chills or night sweats.  No jaundice dark urine or pale stools.  He is only able to keep yogurt, boost  and easily digestible foods.  No sodas, chocolates, chewing gums, artificial sweeteners and candy. No NSAIDs  No history suggestive of lactose or gluten intolerance.  Extensive GI evaluation in the past at digestive health specialists/[facility]: -EGD 07/2019: Tomasso hiatal hernia, mild gastritis.  Otherwise normal EGD.  Omeprazole increased to 40 mg p.o. twice daily. Neg HP -S/P lap chole 2016 d/t biliary  dyskinesia. -Negative solid-phase gastric emptying scan 09/15/2019.  Gastric emptying 83% at 120 min. -Ultrasound abdomen right upper quadrant 09/08/2019 showed mild hepatomegaly.  Liver 17.7 cm, echogenic consistent with fatty liver.  Otherwise unremarkable [place].  CBD 6 mm.  Doppler negative.  No ascites. -CT AP 07/24/2019: With p.o. and IV contrast-negative -Eval in 2017- neg [place] 04/2015 except for fatty liver, negative CT Abdo/pelvis with p.o. and IV contrast 11/05/2014, normal CBC, CMP, lipase.  Had a HIDA scan with ejection fraction (those records are awaited), then underwent laparoscopic cholecystectomy.  Upper GI with Popowski bowel follow-through 10/14/2019: Negative except for constipation.  Colonoscopy 11/2019: -Minimal sigmoid diverticulosis. -Struthers internal hemorrhoids. -Otherwise grossly normal colonoscopy to TI. -Negative TI and random colonic biopsies.    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 Past Medical History:  Diagnosis Date  . Abdominal pain   . Allergy   . Anxiety   . Arthritis   . Asthma   . Diabetes (HCC)   . HTN (hypertension)   . Kidney stone     Past Surgical History:  Procedure Laterality Date  . CHOLECYSTECTOMY  2016  . ESOPHAGOGASTRODUODENOSCOPY  08/18/2019  . HEMORRHOID SURGERY  09/22/2014   Hemorrhoidectomy Dr Logan Bores  . KNEE SURGERY    . SURGERY SCROTAL / TESTICULAR    . UPPER GASTROINTESTINAL ENDOSCOPY  08/18/2019    Family History  Problem Relation Age of Onset  . Diabetes Father   . Colon cancer Neg Hx   . Esophageal cancer Neg Hx   . Rectal cancer Neg Hx   . Stomach cancer Neg Hx     Social History   Tobacco Use  . Smoking status: Never Smoker  . Smokeless tobacco: Current User    Types: Chew  Vaping Use  . Vaping Use: Never used  Substance Use Topics  . Alcohol use: Not Currently  . Drug use: Never    Current Outpatient Medications  Medication Sig Dispense Refill  .  albuterol (VENTOLIN HFA) 108 (90 Base) MCG/ACT inhaler Inhale 2 puffs into the lungs as needed.    Marland Kitchen amitriptyline (ELAVIL) 25 MG tablet TAKE 1 TABLET BY MOUTH AT BEDTIME 30 tablet 6  . busPIRone (BUSPAR) 5 MG tablet Take 1 tablet by mouth 2 (two) times daily.    . Exenatide ER (BYDUREON) 2 MG PEN Inject 1 Dose into the skin once a week.    Marland Kitchen FLUoxetine (PROZAC) 20 MG capsule Take 1 capsule by mouth daily.    . insulin NPH-regular Human (70-30) 100 UNIT/ML injection Inject into the skin 3 (three) times daily. On sliding scale     . promethazine (PHENERGAN) 25 MG tablet Take 1 tablet by mouth as needed.    . RABEprazole (ACIPHEX) 20 MG tablet Take 1 tablet (20 mg total) by mouth daily. 30 tablet 6   No current facility-administered medications for this visit.    Not on File  Review of Systems:  neg Psychiatric/Behavioral: Has anxiety or depression, sleeping problems.     Physical Exam:    BP 116/76  Pulse 93   Ht 5\' 11"  (1.803 m)   Wt 164 lb 6 oz (74.6 kg)   BMI 22.93 kg/m  Wt Readings from Last 3 Encounters:  01/31/20 164 lb 6 oz (74.6 kg)  11/25/19 170 lb (77.1 kg)  11/09/19 186 lb 4 oz (84.5 kg)   Constitutional:  Well-developed, in no acute distress. Psychiatric: Normal mood and affect. Behavior is normal. HEENT: Pupils normal.  Conjunctivae are normal. No scleral icterus. Cardiovascular: Normal rate, regular rhythm. No edema Pulmonary/chest: Effort normal and breath sounds normal. No wheezing, rales or rhonchi. Abdominal: Soft, nondistended.  Has epigastric and right upper quadrant abdominal tenderness.  Well-healed surgical scars, bowel sounds active throughout. There are no masses palpable. No hepatomegaly. Rectal:  defered Neurological: Alert and oriented to person place and time. Skin: Skin is warm and dry. No rashes noted.  Data Reviewed: I have personally reviewed following labs and imaging studies Extensive data was reviewed.   11/11/19, MD 01/31/2020, 8:47  AM  Cc: 04/01/2020, NP

## 2020-02-07 ENCOUNTER — Other Ambulatory Visit: Payer: Self-pay | Admitting: Gastroenterology

## 2020-02-07 LAB — SARS CORONAVIRUS 2 (TAT 6-24 HRS): SARS Coronavirus 2: NEGATIVE

## 2020-02-09 ENCOUNTER — Other Ambulatory Visit: Payer: Self-pay

## 2020-02-09 ENCOUNTER — Ambulatory Visit (AMBULATORY_SURGERY_CENTER): Payer: Self-pay | Admitting: Gastroenterology

## 2020-02-09 ENCOUNTER — Encounter: Payer: Self-pay | Admitting: Gastroenterology

## 2020-02-09 VITALS — BP 115/82 | HR 79 | Temp 97.8°F | Resp 16 | Ht 71.0 in | Wt 164.0 lb

## 2020-02-09 DIAGNOSIS — K2970 Gastritis, unspecified, without bleeding: Secondary | ICD-10-CM

## 2020-02-09 DIAGNOSIS — R112 Nausea with vomiting, unspecified: Secondary | ICD-10-CM

## 2020-02-09 DIAGNOSIS — K449 Diaphragmatic hernia without obstruction or gangrene: Secondary | ICD-10-CM

## 2020-02-09 MED ORDER — SODIUM CHLORIDE 0.9 % IV SOLN: 500.0000 mL | Freq: Once | INTRAVENOUS | Status: AC

## 2020-02-09 NOTE — Progress Notes (Signed)
Called to room to assist during endoscopic procedure.  Patient ID and intended procedure confirmed with present staff. Received instructions for my participation in the procedure from the performing physician.  

## 2020-02-09 NOTE — Op Note (Signed)
[facility] Patient Name: Matthew Cox Procedure Date: 02/09/2020 8:39 AM MRN: [id number] Endoscopist: Lynann Bologna , MD Age: 39 Referring MD:  Date of Birth: 1981/02/09 Gender: Male Account #: [id number] Procedure:                Upper GI endoscopy Indications:              N/V with wt los. RUQ pain with neg extensive GI                            work-up including [place], CT AP, GES. S/P lap chole d/t                            biliary dyskinesia, neg UGI with SB series. Medicines:                Monitored Anesthesia Care Procedure:                Pre-Anesthesia Assessment:                           - Prior to the procedure, a History and Physical                            was performed, and patient medications and                            allergies were reviewed. The patient's tolerance of                            previous anesthesia was also reviewed. The risks                            and benefits of the procedure and the sedation                            options and risks were discussed with the patient.                            All questions were answered, and informed consent                            was obtained. Prior Anticoagulants: The patient has                            taken no previous anticoagulant or antiplatelet                            agents. ASA Grade Assessment: II - A patient with                            mild systemic disease. After reviewing the risks                            and benefits, the patient was deemed in  satisfactory condition to undergo the procedure.                           After obtaining informed consent, the endoscope was                            passed under direct vision. Throughout the                            procedure, the patient's blood pressure, pulse, and                            oxygen saturations were monitored continuously. The                            Endoscope was  introduced through the mouth, and                            advanced to the second part of duodenum. The upper                            GI endoscopy was accomplished without difficulty.                            The patient tolerated the procedure well. Scope In: Scope Out: Findings:                 The examined esophagus was normal with well-defined                            Z-line at 35 cm. Examined by NBI. Biopsies were                            taken with a cold forceps for histology from                            proximal, mid and distal esophagus.                           A Danh hiatal hernia was present.                           Diffuse mild inflammation characterized by erythema                            was found in the gastric body and in the gastric                            antrum. Biopsies were taken with a cold forceps for                            histology. The retroflexed examination of the  cardia was normal.                           The examined duodenum was normal. Biopsies for                            histology were taken with a cold forceps for                            evaluation of celiac disease. Complications:            No immediate complications. Estimated Blood Loss:     Estimated blood loss: none. Impression:               - [name].                           - Mild Gastritis. Biopsied. Recommendation:           - Patient has a contact number available for                            emergencies. The signs and symptoms of potential                            delayed complications were discussed with the                            patient. Return to normal activities tomorrow.                            Written discharge instructions were provided to the                            patient.                           - Resume previous diet.                           - Continue AcipHex 20 mg p.o. twice daily  x 4                            weeks, then decrease it to once a day.                           - Continue amitriptyline 50 mg p.o. nightly.                           - Await pathology results.                           - No aspirin, ibuprofen, naproxen, or other                            non-steroidal anti-inflammatory drugs.                           -  The findings and recommendations were discussed                            with the patient's mom.                           - Continue using Zofran on as needed basis for now.                           - He is to follow-up with [name] NP as well. Lynann Bologna, MD 02/09/2020 8:58:10 AM This report has been signed electronically.

## 2020-02-09 NOTE — Progress Notes (Signed)
A and O x3. Report to RN. Tolerated MAC anesthesia well.Teeth unchanged after procedure.

## 2020-02-09 NOTE — Patient Instructions (Signed)
YOU HAD AN ENDOSCOPIC PROCEDURE TODAY AT THE [facility]:   Refer to the procedure report that was given to you for any specific questions about what was found during the examination.  If the procedure report does not answer your questions, please call your gastroenterologist to clarify.  If you requested that your care partner not be given the details of your procedure findings, then the procedure report has been included in a sealed envelope for you to review at your convenience later.  YOU SHOULD EXPECT: Some feelings of bloating in the abdomen. Passage of more gas than usual.  Walking can help get rid of the air that was put into your GI tract during the procedure and reduce the bloating. If you had a lower endoscopy (such as a colonoscopy or flexible sigmoidoscopy) you may notice spotting of blood in your stool or on the toilet paper. If you underwent a bowel prep for your procedure, you may not have a normal bowel movement for a few days.  Please Note:  You might notice some irritation and congestion in your nose or some drainage.  This is from the oxygen used during your procedure.  There is no need for concern and it should clear up in a day or so.  SYMPTOMS TO REPORT IMMEDIATELY:    Following upper endoscopy (EGD)  Vomiting of blood or coffee ground material  New chest pain or pain under the shoulder blades  Painful or persistently difficult swallowing  New shortness of breath  Fever of 100F or higher  Black, tarry-looking stools  For urgent or emergent issues, a gastroenterologist can be reached at any hour by calling [phone]. Do not use MyChart messaging for urgent concerns.    DIET:  We do recommend a Bong meal at first, but then you may proceed to your regular diet.  Drink plenty of fluids but you should avoid alcoholic beverages for 24 hours.  MEDICATIONS: Continue AcipHex 20 mg by mouth twice daily X 4 weeks, then decrease it to once a day. Continue Zofran  on an as needed basis for now. Continue Amitriptyline 50 mg by mouth nightly. No Aspirin, Ibuprofen, Naproxen, or other non-steroidal anti-inflammatory drugs.  FOLLOW UP: Follow up with [name] NP as well.  Please see handouts given to you by your recovery nurse.   ACTIVITY:  You should plan to take it easy for the rest of today and you should NOT DRIVE or use heavy machinery until tomorrow (because of the sedation medicines used during the test).    FOLLOW UP: Our staff will call the number listed on your records 48-72 hours following your procedure to check on you and address any questions or concerns that you may have regarding the information given to you following your procedure. If we do not reach you, we will leave a message.  We will attempt to reach you two times.  During this call, we will ask if you have developed any symptoms of COVID 19. If you develop any symptoms (ie: fever, flu-like symptoms, shortness of breath, cough etc.) before then, please call [phone].  If you test positive for Covid 19 in the 2 weeks post procedure, please call and report this information to [place].    If any biopsies were taken you will be contacted by phone or by letter within the next 1-3 weeks.  Please call us at [phone] if you have not heard about the biopsies in 3 weeks.   Thank you for  allowing [place] to provide for your healthcare needs today.   SIGNATURES/CONFIDENTIALITY: You and/or your care partner have signed paperwork which will be entered into your electronic medical record.  These signatures attest to the fact that that the information above on your After Visit Summary has been reviewed and is understood.  Full responsibility of the confidentiality of this discharge information lies with you and/or your care-partner.

## 2020-02-09 NOTE — Progress Notes (Signed)
robinol antisialogogue zofran nausea lidocaine buffer

## 2020-02-13 ENCOUNTER — Telehealth: Payer: Self-pay | Admitting: *Deleted

## 2020-02-13 ENCOUNTER — Telehealth: Payer: Self-pay

## 2020-02-13 NOTE — Telephone Encounter (Signed)
LVM

## 2020-02-13 NOTE — Telephone Encounter (Signed)
  Follow up Call-  Call back number 02/09/2020 11/25/2019  Post procedure Call Back phone  # [phone]  Permission to leave phone message Yes Yes     Patient questions:  Message left to call us if necessary. Second call.

## 2020-02-15 ENCOUNTER — Encounter: Payer: Self-pay | Admitting: Gastroenterology

## 2020-02-15 ENCOUNTER — Ambulatory Visit (INDEPENDENT_AMBULATORY_CARE_PROVIDER_SITE_OTHER): Payer: Self-pay | Admitting: Gastroenterology

## 2020-02-15 VITALS — BP 110/80 | HR 96 | Ht 71.0 in | Wt 164.5 lb

## 2020-02-15 DIAGNOSIS — R112 Nausea with vomiting, unspecified: Secondary | ICD-10-CM

## 2020-02-15 DIAGNOSIS — R1011 Right upper quadrant pain: Secondary | ICD-10-CM

## 2020-02-15 DIAGNOSIS — R634 Abnormal weight loss: Secondary | ICD-10-CM

## 2020-02-15 NOTE — Patient Instructions (Signed)
If you are age 37 or older, your body mass index should be between 23-30. Your Body mass index is 22.94 kg/m. If this is out of the aforementioned range listed, please consider follow up with your Primary Care Provider.  If you are age 39 or younger, your body mass index should be between 19-25. Your Body mass index is 22.94 kg/m. If this is out of the aformentioned range listed, please consider follow up with your Primary Care Provider.   You have been scheduled for a CT scan of the abdomen and pelvis at [facility] are scheduled on 02/23/20 at 8am. You should arrive 15 minutes prior to your appointment time for registration. Please follow the written instructions below on the day of your exam:  1) Do not eat or drink anything after midnight.   This test typically takes 30-45 minutes to complete.  If you have any questions regarding your exam or if you need to reschedule, you may call the CT department at [phone] between the hours of 8:00 am and 5:00 pm, Monday-Friday.  ________________________________________________________________________  Please go to the lab at [facility] ([address] [place] You will need to go to level "B", you do not need an appointment for this. Hours available are 7:30 am - 4:30 pm.  Please have these labs done before the day of your Ct Scan or your test will be canceled.    Thank you,  Dr. Lynann Bologna

## 2020-02-15 NOTE — Progress Notes (Signed)
Chief Complaint: RUQ pain  Referring Provider:  Yvonne Kendall, NP      ASSESSMENT AND PLAN;   #1.  Recurrent N/V with no further wt loss. Neg EGD Feb 09, 2020 with neg eso/gastric/SB bx. I believe anxiety playing a significant role.  #2. Longstanding RUQ pain. Neg extensive GI WP as detailed below including EGD, [place], CT AP, GES. S/P lap chole d/t biliary dyskinesia, neg UGI with SB series.  Failed multiple medications including BuSpar, Bentyl, Phenergan, Carafate, Protonix.  Has associated back pain/anxiety/depression.  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  #3. GERD with [facility] N/V. Neg UGI with SB series 10/14/2019  #4. Wt loss (resolved). Neg celiac screen, Nl TSH. Neg colon 11/25/2019  #5. Fatty liver with Nl LFTs.  No cirrhosis. Dx [place] 04/2015  Plan: -Aciphex 20mg  po bid x 3 weeks, then QD, #30, 6 refills. -CTA Abdo/pelvis to rule out mesenteric ischemia. -CBC, CMP, CRP. -Continue Zofran on as needed basis. -Increase Amitryptline 50 mg po qhs #30.  This has helped. -? Trial of remeron therafter, if OK with Ms .  This would also stimulate his appetite.  He will follow up with Ms. McGee as well.  I do believe anxiety/depression is playing a significant role in his GI symptoms. -If still with problems, would refer him to a tertiary care center like [facility] or [facility] for their opinion.   HPI:    Matthew Cox is a 39 y.o. male  For follow-up visit.  S/P EGD as above February 09, 2020 with negative esophageal, gastric and Dickison bowel biopsies.  Continues to have intermittent N/V despite extensive negative GI work-up.  No further weight loss. Only able to keep down crackers, boost, spirte.  He still has chronic RUQ abdominal pain with extensive neg work-up.  We need to rule out any mesenteric ischemia.  Wt Readings from Last 3 Encounters:  02/15/20 164 lb 8 oz (74.6 kg)  02/09/20 164 lb (74.4 kg)    01/31/20 164 lb 6 oz (74.6 kg)   No fever chills or night sweats.  He has been sleeping better ever since he has been on amitriptyline.  Most recently had negative colonoscopy to TI with negative random colonic and TI biopsies.  Upper GI with [facility] bowel follow-through on 10/14/2019 was negative except for constipation.  He has started MiraLAX once a day with good resultant improvement in constipation.  Currently having normal bowel movements.  No melena or hematochezia.  His labs revealed normal CBC except for decreased MCV at 76, elevated CRP.  Negative celiac screen, TSH and normal CMP.  Unfortunately, he had hairline fracture R ankle and is currently under Ortho care.  Wt Readings from Last 3 Encounters:  02/15/20 164 lb 8 oz (74.6 kg)  02/09/20 164 lb (74.4 kg)  01/31/20 164 lb 6 oz (74.6 kg)      From previous notes: with history of DM2, HTN, asthma, longstanding GI problems, anxiety/depression With over 5-6 yrs H/O mod- severe RUQ abdo pain Evaluated extensively in 2017- neg 2018 except fatty liver, negative CT Abdo/pelvis.  Underwent HIDA scan (report awaited), diagnosed with biliary dyskinesia and underwent laparoscopic cholecystectomy with IOC. This did help him over the next few years.  However, the abdominal pain never completely went away.  He started having similar problems earlier this year and again underwent extensive evaluation by digestive health specialists -including neg EGD except Schoch hiatal hernia, negative CT Abdo/pelvis, negative  ultrasound except for fatty liver.  Negative solid-phase gastric emptying scan.  Describes RUQ pain as sharp, stabbing, wrapping around the abdomen but never crossing midline.  He does have some back pain as well.  Associated with nausea/vomiting.  Not able to keep much down.  Has lost weight from 235 pounds in February 2021 to current weight of 195 pounds.(On review however, his weight was 205 pounds June 26, 2015).  He denies having  any associated diarrhea or constipation.  No melena or hematochezia.  No fever chills or night sweats.  No jaundice dark urine or pale stools.  He is only able to keep yogurt, boost and easily digestible foods.  No sodas, chocolates, chewing gums, artificial sweeteners and candy. No NSAIDs  No history suggestive of lactose or gluten intolerance.  Extensive GI evaluation in the past at digestive health specialists/[facility]: -EGD 02/09/2020, 07/2019: Bradwell hiatal hernia, mild gastritis.  Otherwise normal EGD.  Omeprazole increased to 40 mg p.o. twice daily. Neg HP -S/P lap chole 2016 d/t biliary dyskinesia. -Negative solid-phase gastric emptying scan 09/15/2019.  Gastric emptying 83% at 120 min. -Ultrasound abdomen right upper quadrant 09/08/2019 showed mild hepatomegaly.  Liver 17.7 cm, echogenic consistent with fatty liver.  Otherwise unremarkable [place].  CBD 6 mm.  Doppler negative.  No ascites. -CT AP 07/24/2019: With p.o. and IV contrast-negative -Eval in 2017- neg [place] 04/2015 except for fatty liver, negative CT Abdo/pelvis with p.o. and IV contrast 11/05/2014, normal CBC, CMP, lipase.  Had a HIDA scan with ejection fraction (those records are awaited), then underwent laparoscopic cholecystectomy.  Upper GI with [facility] bowel follow-through 10/14/2019: Negative except for constipation.  Colonoscopy 11/2019: -Minimal sigmoid diverticulosis. -[place] internal hemorrhoids. -Otherwise grossly normal colonoscopy to TI. -Negative TI and random colonic biopsies.    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 Past Medical History:  Diagnosis Date  . Abdominal pain   . Allergy   . Anxiety   . Arthritis   . Asthma   . Diabetes (HCC)   . HTN (hypertension)   . Kidney stone     Past Surgical History:  Procedure Laterality Date  . CHOLECYSTECTOMY  2016  . ESOPHAGOGASTRODUODENOSCOPY  08/18/2019  . HEMORRHOID SURGERY  09/22/2014   Hemorrhoidectomy Dr  Logan Bores  . KNEE SURGERY    . SURGERY SCROTAL / TESTICULAR    . UPPER GASTROINTESTINAL ENDOSCOPY  08/18/2019    Family History  Problem Relation Age of Onset  . Diabetes Father   . Colon cancer Neg Hx   . Esophageal cancer Neg Hx   . Rectal cancer Neg Hx   . Stomach cancer Neg Hx     Social History   Tobacco Use  . Smoking status: Never Smoker  . Smokeless tobacco: Current User    Types: Chew  Vaping Use  . Vaping Use: Never used  Substance Use Topics  . Alcohol use: Not Currently  . Drug use: Never    Current Outpatient Medications  Medication Sig Dispense Refill  . albuterol (VENTOLIN HFA) 108 (90 Base) MCG/ACT inhaler Inhale 2 puffs into the lungs as needed.     Marland Kitchen amitriptyline (ELAVIL) 50 MG tablet Take 1 tablet (50 mg total) by mouth at bedtime. 30 tablet 6  . busPIRone (BUSPAR) 5 MG tablet Take 1 tablet by mouth 2 (two) times daily.     . Exenatide ER (BYDUREON) 2 MG PEN Inject 1 Dose into the skin once  a week.    Marland Kitchen FLUoxetine (PROZAC) 20 MG capsule Take 1 capsule by mouth daily.     . promethazine (PHENERGAN) 25 MG tablet Take 1 tablet by mouth as needed.    . RABEprazole (ACIPHEX) 20 MG tablet Take 1 tablet (20 mg total) by mouth in the morning and at bedtime. 60 tablet 6   Current Facility-Administered Medications  Medication Dose Route Frequency Provider Last Rate Last Admin  . 0.9 %  sodium chloride infusion  500 mL Intravenous Once Lynann Bologna, MD        No Known Allergies  Review of Systems:  neg Psychiatric/Behavioral: Has anxiety or depression, sleeping problems.     Physical Exam:    BP 110/80   Pulse 96   Ht 5\' 11"  (1.803 m)   Wt 164 lb 8 oz (74.6 kg)   BMI 22.94 kg/m  Wt Readings from Last 3 Encounters:  02/15/20 164 lb 8 oz (74.6 kg)  02/09/20 164 lb (74.4 kg)  01/31/20 164 lb 6 oz (74.6 kg)   Constitutional:  Well-developed, in no acute distress. Psychiatric: Normal mood and affect. Behavior is normal. Abdominal: Soft, nondistended.   Has epigastric and right upper quadrant abdominal tenderness.  Well-healed surgical scars, bowel sounds active throughout. There are no masses palpable. No hepatomegaly. Rectal:  defered Neurological: Alert and oriented to person place and time. Skin: Skin is warm and dry. No rashes noted.  Data Reviewed: I have personally reviewed following labs and imaging studies Extensive data was reviewed.   04/01/20, MD 02/15/2020, 10:33 AM  Cc: 02/17/2020, NP

## 2020-02-20 ENCOUNTER — Other Ambulatory Visit (INDEPENDENT_AMBULATORY_CARE_PROVIDER_SITE_OTHER): Payer: Self-pay

## 2020-02-20 DIAGNOSIS — R1011 Right upper quadrant pain: Secondary | ICD-10-CM

## 2020-02-20 DIAGNOSIS — R634 Abnormal weight loss: Secondary | ICD-10-CM

## 2020-02-20 DIAGNOSIS — R112 Nausea with vomiting, unspecified: Secondary | ICD-10-CM

## 2020-02-20 LAB — C-REACTIVE PROTEIN: CRP: 1.3 mg/dL (ref 0.5–20.0)

## 2020-02-20 LAB — CBC WITH DIFFERENTIAL/PLATELET
Basophils Absolute: 0.1 10*3/uL (ref 0.0–0.1)
Basophils Relative: 1.3 % (ref 0.0–3.0)
Eosinophils Absolute: 0.2 10*3/uL (ref 0.0–0.7)
Eosinophils Relative: 2.7 % (ref 0.0–5.0)
HCT: 44.5 % (ref 39.0–52.0)
Hemoglobin: 15 g/dL (ref 13.0–17.0)
Lymphocytes Relative: 50 % — ABNORMAL HIGH (ref 12.0–46.0)
Lymphs Abs: 3.1 10*3/uL (ref 0.7–4.0)
MCHC: 33.8 g/dL (ref 30.0–36.0)
MCV: 73.6 fl — ABNORMAL LOW (ref 78.0–100.0)
Monocytes Absolute: 0.4 10*3/uL (ref 0.1–1.0)
Monocytes Relative: 6.7 % (ref 3.0–12.0)
Neutro Abs: 2.4 10*3/uL (ref 1.4–7.7)
Neutrophils Relative %: 39.3 % — ABNORMAL LOW (ref 43.0–77.0)
Platelets: 282 10*3/uL (ref 150.0–400.0)
RBC: 6.04 Mil/uL — ABNORMAL HIGH (ref 4.22–5.81)
RDW: 14.3 % (ref 11.5–15.5)
WBC: 6.2 10*3/uL (ref 4.0–10.5)

## 2020-02-20 LAB — COMPREHENSIVE METABOLIC PANEL
ALT: 9 U/L (ref 0–53)
AST: 15 U/L (ref 0–37)
Albumin: 4.5 g/dL (ref 3.5–5.2)
Alkaline Phosphatase: 108 U/L (ref 39–117)
BUN: 10 mg/dL (ref 6–23)
CO2: 29 mEq/L (ref 19–32)
Calcium: 9.6 mg/dL (ref 8.4–10.5)
Chloride: 101 mEq/L (ref 96–112)
Creatinine, Ser: 0.98 mg/dL (ref 0.40–1.50)
GFR: 98.97 mL/min (ref >60.00)
Glucose, Bld: 100 mg/dL — ABNORMAL HIGH (ref 70–99)
Potassium: 3.5 mEq/L (ref 3.5–5.1)
Sodium: 140 mEq/L (ref 135–145)
Total Bilirubin: 0.7 mg/dL (ref 0.2–1.2)
Total Protein: 8.1 g/dL (ref 6.0–8.3)

## 2020-02-23 ENCOUNTER — Other Ambulatory Visit: Payer: Self-pay

## 2020-02-23 ENCOUNTER — Ambulatory Visit (HOSPITAL_COMMUNITY)
Admission: RE | Admit: 2020-02-23 | Discharge: 2020-02-23 | Disposition: A | Discharge status: Home / Self Care | Payer: Self-pay | Source: Ambulatory Visit | Attending: Gastroenterology | Admitting: Gastroenterology

## 2020-02-23 ENCOUNTER — Encounter (HOSPITAL_COMMUNITY): Payer: Self-pay

## 2020-02-23 DIAGNOSIS — R112 Nausea with vomiting, unspecified: Secondary | ICD-10-CM

## 2020-02-23 DIAGNOSIS — R1011 Right upper quadrant pain: Secondary | ICD-10-CM

## 2020-02-23 DIAGNOSIS — R634 Abnormal weight loss: Secondary | ICD-10-CM

## 2020-02-23 MED ORDER — IOHEXOL 350 MG/ML SOLN
100.0000 mL | Freq: Once | INTRAVENOUS | Status: AC | PRN
  Administered 2020-02-23: 100 mL via INTRAVENOUS

## 2021-04-15 IMAGING — CT CT CTA ABD/PEL W/CM AND/OR W/O CM
3 of 11 series · 11 of 46 positions shown, 17 images · IV contrast (OMNIPAQUE)
Comparison: CT abdomen and pelvis 10/06/2014

CLINICAL DATA: 38-year-old with epigastric pain with recurrent
nausea and vomiting. Weight loss.

EXAM:
CTA ABDOMEN AND PELVIS WITHOUT AND WITH CONTRAST
TECHNIQUE: Multidetector CT imaging of the abdomen and pelvis was performed
using the standard protocol during bolus administration of
intravenous contrast. Multiplanar reconstructed images and MIPs were
obtained and reviewed to evaluate the vascular anatomy.
CONTRAST:  100mL OMNIPAQUE IOHEXOL 350 MG/ML SOLN

[Series 4: axial arterial · axial · arterial · 0.79mm/px · z∈[+1045,+1097]mm · 2 of 231 slices shown]
[im 26/231  soft-tissue]
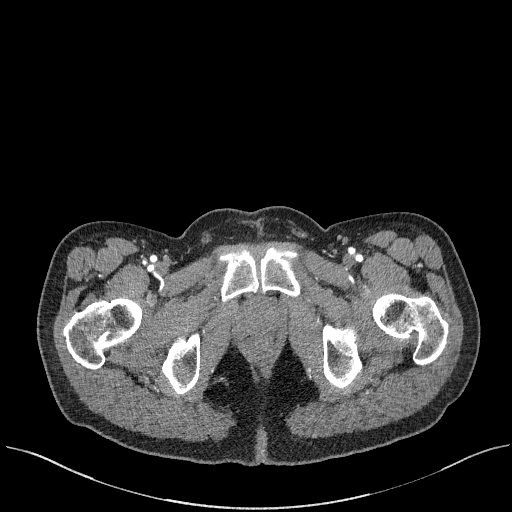
[im 52/231  soft-tissue]
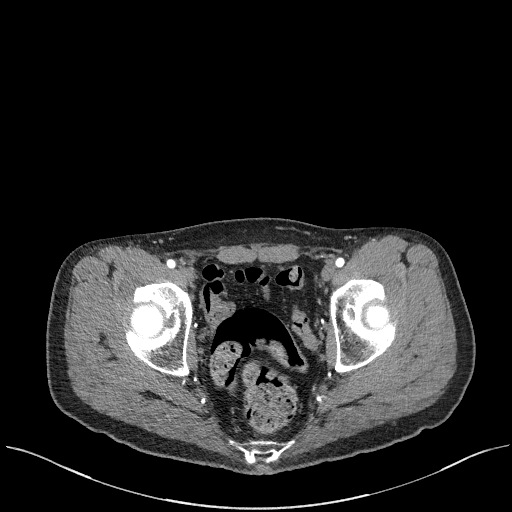

[Series 6: axial venous · axial · portal-venous · 0.79mm/px · z∈[+1051,+1397]mm · 7 of 231 slices shown, 12 images]
[im 29/231  soft-tissue]
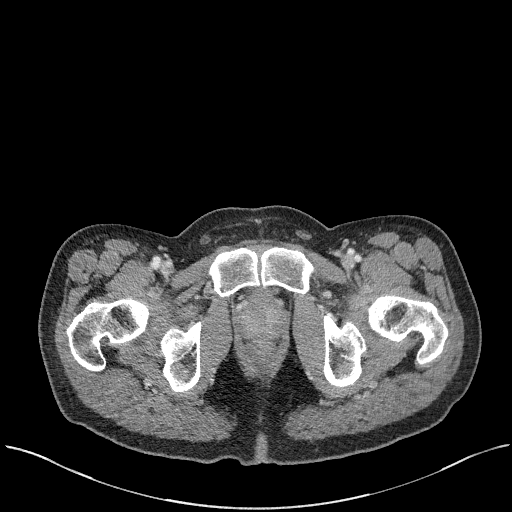
[im 29/231  bone]
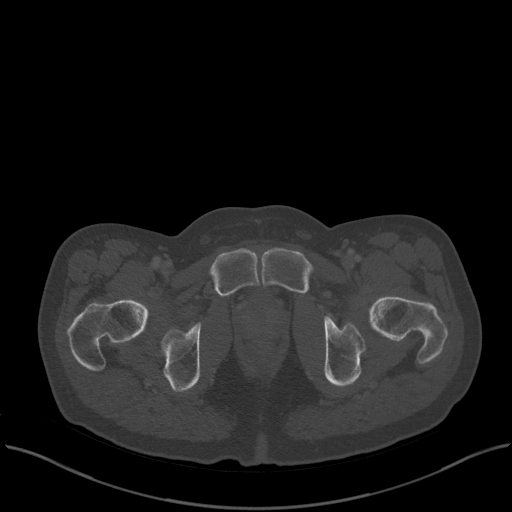
[im 58/231  soft-tissue]
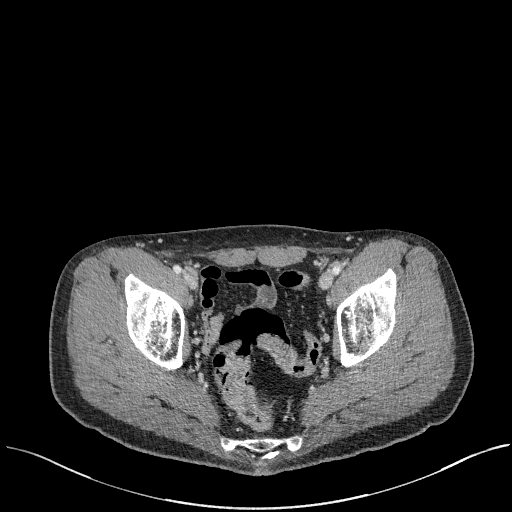
[im 87/231  soft-tissue]
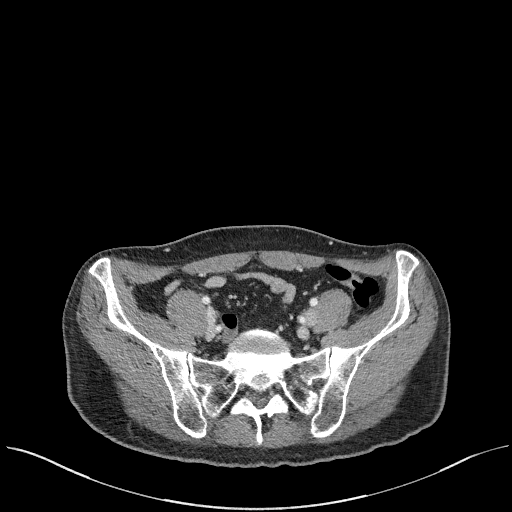
[im 116/231  soft-tissue]
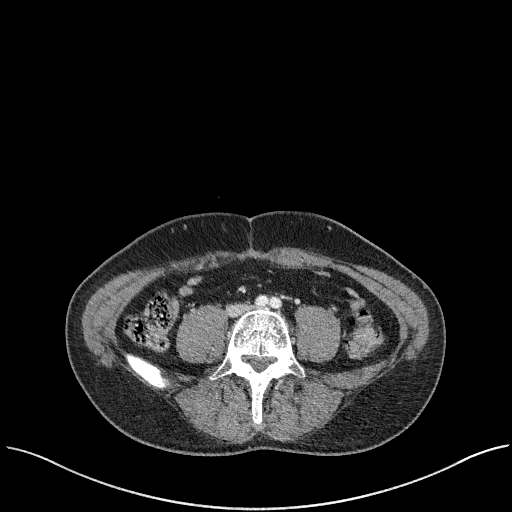
[im 116/231  lung]
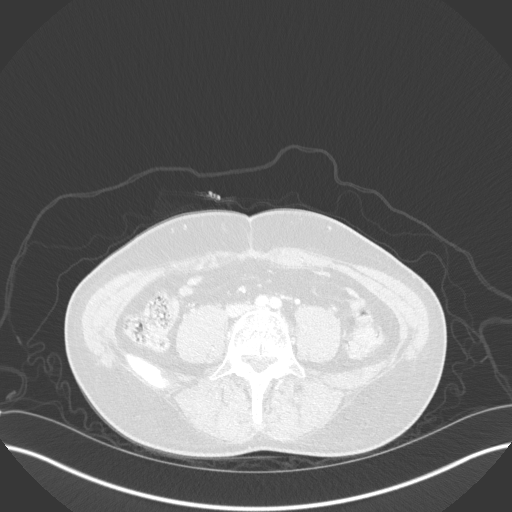
[im 144/231  soft-tissue]
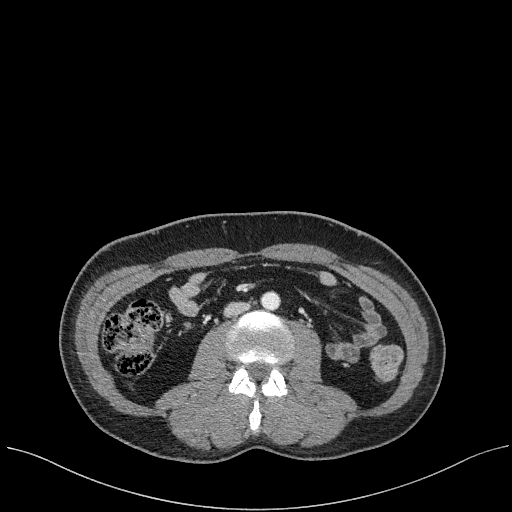
[im 144/231  lung]
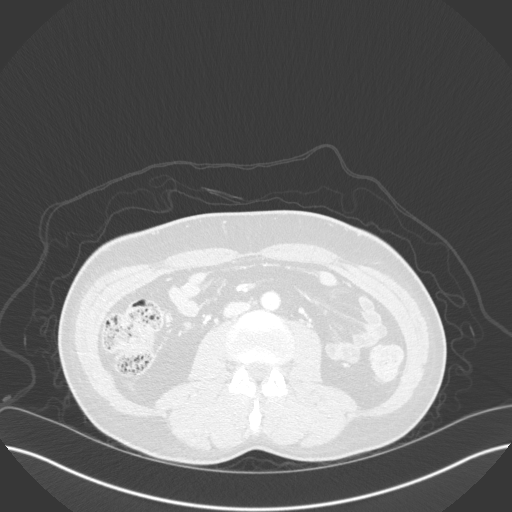
[im 173/231  soft-tissue]
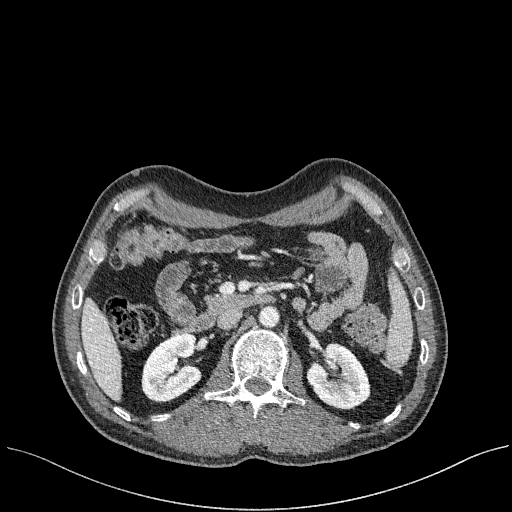
[im 173/231  lung]
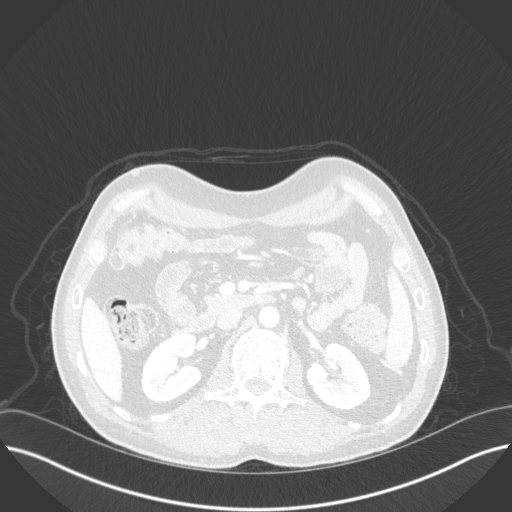
[im 202/231  soft-tissue]
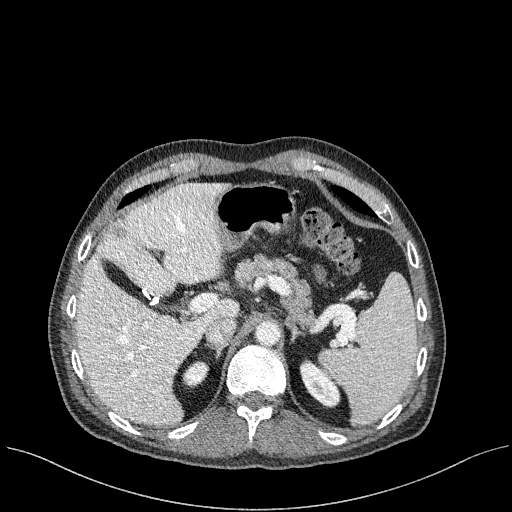
[im 202/231  lung]
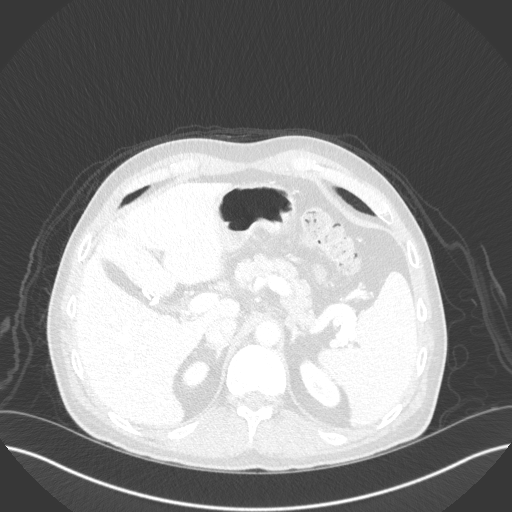

[Series 14: coronal mpr · coronal · 0.81mm/px · 2 of 119 slices shown, 3 images]
[im 40/119  soft-tissue]
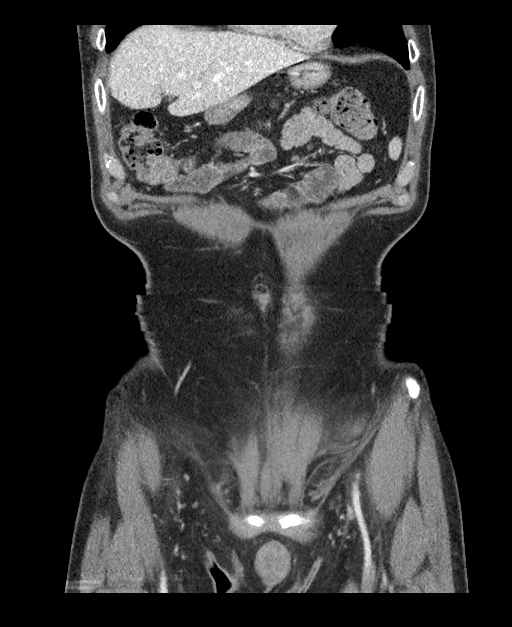
[im 40/119  bone]
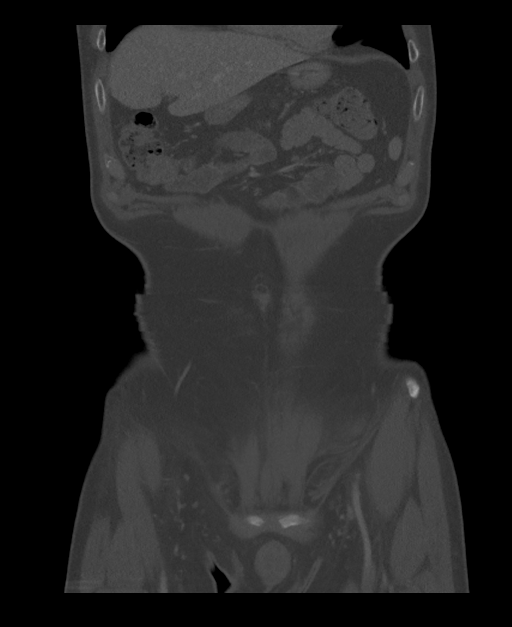
[im 79/119  soft-tissue]
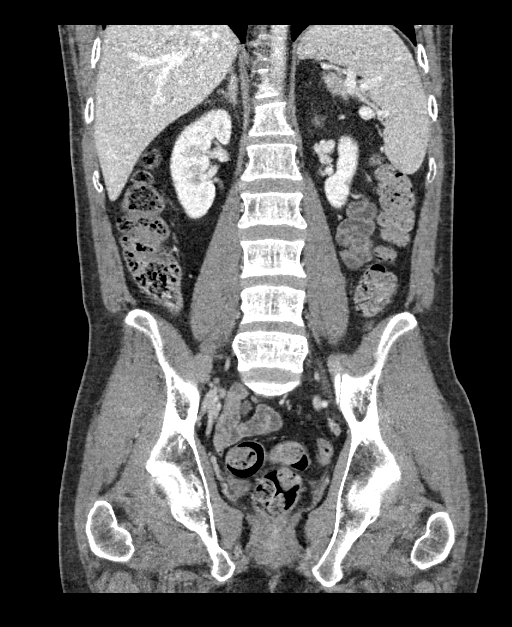

[11 of 46 positions shown; findings below may reference images not displayed]

FINDINGS: VASCULAR

Aorta: Normal caliber aorta without aneurysm, dissection, or
significant stenosis.

Celiac: Patent without evidence of aneurysm, dissection or
significant stenosis.

SMA: Patent without evidence of aneurysm, dissection or significant
stenosis. Incidentally, there is a replaced right hepatic lobe which
is a normal variant.

Renals: 2 right renal arteries without evidence of stenosis,
aneurysm or dissection. Limited evaluation for vasculitis and FMD to
excessive motion artifact. Single left renal artery is widely patent
without stenosis, aneurysm or dissection.

IMA: Patent

Inflow: Patent without evidence of aneurysm, dissection, vasculitis
or significant stenosis.

Proximal Outflow: Proximal femoral arteries are patent bilaterally.

Veins: Hepatic veins are patent. Portal venous system is patent. SMV
is patent. Splenic vein is patent. Normal appearance of the IVC and
renal veins. Compression on the left common iliac vein from the
right common iliac artery is a common anatomic finding. No gross
abnormality to the iliac veins.

Review of the MIP images confirms the above findings.

NON-VASCULAR

Lower chest: Lung bases are clear.

Hepatobiliary: Significant motion artifact on the arterial images.
Poorly defined hypodensity in segment 4 of the liver which could be
related to focal fat or possibly a cyst. Gallbladder has been
removed. No biliary dilatation.

Pancreas: Unremarkable. No pancreatic ductal dilatation or
surrounding inflammatory changes.

Spleen: Spleen is slightly prominent for size but stable.
Craniocaudal dimension of the spleen is roughly 11.8 cm and this is
similar to the prior examination.

Adrenals/Urinary Tract: Normal adrenal glands. Normal appearance of
both kidneys without hydronephrosis. No suspicious renal lesion.
Urinary bladder is decompressed.

Stomach/Bowel: Stomach is within normal limits. Appendix appears
normal. No evidence of bowel wall thickening, distention, or
inflammatory changes.

Lymphatic: No abdominal or pelvic lymphadenopathy.

Reproductive: Prostate is unremarkable.

Other: Negative for ascites.  Negative for free air.

Musculoskeletal: Chronic right-sided pars defect at L5. Normal
alignment in lumbar spine.
IMPRESSION: VASCULAR

Arterial structures in the abdomen and pelvis are widely patent. No
evidence for mesenteric stenosis. No significant atherosclerotic
disease.

NON-VASCULAR

No acute abnormality in the abdomen or pelvis.

Cholecystectomy.
